# Patient Record
Sex: Male | Born: 1975 | Race: White | Hispanic: No | Marital: Single
Health system: Southern US, Community
[De-identification: ages and names within clinical notes are randomized; demographics above are authoritative.]

---

## 2007-08-11 ENCOUNTER — Other Ambulatory Visit: Payer: Self-pay

## 2007-08-12 ENCOUNTER — Ambulatory Visit: Payer: Self-pay | Admitting: Psychiatry

## 2007-08-12 ENCOUNTER — Inpatient Hospital Stay (HOSPITAL_COMMUNITY): Admission: AD | Admit: 2007-08-12 | Discharge: 2007-08-20 | Payer: Self-pay | Admitting: Psychiatry

## 2008-05-06 ENCOUNTER — Emergency Department (HOSPITAL_COMMUNITY): Admission: EM | Admit: 2008-05-06 | Discharge: 2008-05-06 | Payer: Self-pay | Admitting: Emergency Medicine

## 2009-02-14 ENCOUNTER — Encounter: Payer: Self-pay | Admitting: Emergency Medicine

## 2009-02-15 ENCOUNTER — Inpatient Hospital Stay (HOSPITAL_COMMUNITY): Admission: RE | Admit: 2009-02-15 | Discharge: 2009-02-22 | Payer: Self-pay | Admitting: Psychiatry

## 2009-02-15 ENCOUNTER — Ambulatory Visit: Payer: Self-pay | Admitting: Psychiatry

## 2009-02-16 ENCOUNTER — Other Ambulatory Visit: Payer: Self-pay | Admitting: Emergency Medicine

## 2009-03-16 ENCOUNTER — Other Ambulatory Visit: Payer: Self-pay | Admitting: Emergency Medicine

## 2009-03-16 ENCOUNTER — Inpatient Hospital Stay (HOSPITAL_COMMUNITY): Admission: RE | Admit: 2009-03-16 | Discharge: 2009-03-24 | Payer: Self-pay | Admitting: Psychiatry

## 2009-03-16 ENCOUNTER — Ambulatory Visit: Payer: Self-pay | Admitting: Psychiatry

## 2009-05-13 ENCOUNTER — Emergency Department (HOSPITAL_COMMUNITY): Admission: EM | Admit: 2009-05-13 | Discharge: 2009-05-13 | Payer: Self-pay | Admitting: Emergency Medicine

## 2011-01-13 LAB — CBC
HCT: 45.4 % (ref 39.0–52.0)
MCV: 87.7 fL (ref 78.0–100.0)
Platelets: 169 10*3/uL (ref 150–400)
RBC: 5.17 MIL/uL (ref 4.22–5.81)
WBC: 9.5 10*3/uL (ref 4.0–10.5)

## 2011-01-13 LAB — DIFFERENTIAL
Basophils Absolute: 0 10*3/uL (ref 0.0–0.1)
Basophils Relative: 0 % (ref 0–1)
Monocytes Relative: 6 % (ref 3–12)
Neutro Abs: 6.8 10*3/uL (ref 1.7–7.7)
Neutrophils Relative %: 71 % (ref 43–77)

## 2011-01-13 LAB — BASIC METABOLIC PANEL
BUN: 10 mg/dL (ref 6–23)
Chloride: 107 mEq/L (ref 96–112)
GFR calc Af Amer: >60 mL/min (ref >60)
GFR calc non Af Amer: >60 mL/min (ref >60)
Potassium: 3.5 mEq/L (ref 3.5–5.1)
Sodium: 139 mEq/L (ref 135–145)

## 2011-01-13 LAB — ETHANOL: Alcohol, Ethyl (B): <5 mg/dL (ref 0–10)

## 2011-01-13 LAB — RAPID URINE DRUG SCREEN, HOSP PERFORMED: Tetrahydrocannabinol: NOT DETECTED

## 2011-01-15 LAB — GLUCOSE, CAPILLARY
Glucose-Capillary: 148 mg/dL — ABNORMAL HIGH (ref 70–99)
Glucose-Capillary: 200 mg/dL — ABNORMAL HIGH (ref 70–99)
Glucose-Capillary: 228 mg/dL — ABNORMAL HIGH (ref 70–99)
Glucose-Capillary: 103 mg/dL — ABNORMAL HIGH (ref 70–99)
Glucose-Capillary: 105 mg/dL — ABNORMAL HIGH (ref 70–99)
Glucose-Capillary: 114 mg/dL — ABNORMAL HIGH (ref 70–99)
Glucose-Capillary: 125 mg/dL — ABNORMAL HIGH (ref 70–99)
Glucose-Capillary: 125 mg/dL — ABNORMAL HIGH (ref 70–99)
Glucose-Capillary: 134 mg/dL — ABNORMAL HIGH (ref 70–99)
Glucose-Capillary: 154 mg/dL — ABNORMAL HIGH (ref 70–99)
Glucose-Capillary: 176 mg/dL — ABNORMAL HIGH (ref 70–99)
Glucose-Capillary: 178 mg/dL — ABNORMAL HIGH (ref 70–99)
Glucose-Capillary: 79 mg/dL (ref 70–99)
Glucose-Capillary: 92 mg/dL (ref 70–99)
Glucose-Capillary: 97 mg/dL (ref 70–99)
Glucose-Capillary: 80 mg/dL (ref 70–99)

## 2011-01-15 LAB — RAPID URINE DRUG SCREEN, HOSP PERFORMED
Amphetamines: NOT DETECTED
Barbiturates: NOT DETECTED

## 2011-01-15 LAB — CBC
MCHC: 35.2 g/dL (ref 30.0–36.0)
MCV: 88.1 fL (ref 78.0–100.0)
Platelets: 160 10*3/uL (ref 150–400)
RDW: 13.8 % (ref 11.5–15.5)

## 2011-01-15 LAB — BASIC METABOLIC PANEL
BUN: 11 mg/dL (ref 6–23)
CO2: 31 mEq/L (ref 19–32)
Chloride: 105 mEq/L (ref 96–112)
Creatinine, Ser: 0.71 mg/dL (ref 0.4–1.5)
Glucose, Bld: 116 mg/dL — ABNORMAL HIGH (ref 70–99)

## 2011-01-15 LAB — DIFFERENTIAL
Basophils Absolute: 0 10*3/uL (ref 0.0–0.1)
Basophils Relative: 0 % (ref 0–1)
Eosinophils Absolute: 0.1 10*3/uL (ref 0.0–0.7)
Monocytes Relative: 9 % (ref 3–12)
Neutrophils Relative %: 67 % (ref 43–77)

## 2011-01-15 LAB — URINALYSIS, ROUTINE W REFLEX MICROSCOPIC
Hgb urine dipstick: NEGATIVE
Protein, ur: NEGATIVE mg/dL
Urobilinogen, UA: 0.2 mg/dL (ref 0.0–1.0)

## 2011-01-15 LAB — VALPROIC ACID LEVEL: Valproic Acid Lvl: 31.4 ug/mL — ABNORMAL LOW (ref 50.0–100.0)

## 2011-01-16 LAB — URINALYSIS, ROUTINE W REFLEX MICROSCOPIC
Bilirubin Urine: NEGATIVE
Glucose, UA: NEGATIVE mg/dL
Glucose, UA: NEGATIVE mg/dL
Hgb urine dipstick: NEGATIVE
Nitrite: NEGATIVE
Protein, ur: NEGATIVE mg/dL
Protein, ur: NEGATIVE mg/dL
Urobilinogen, UA: 0.2 mg/dL (ref 0.0–1.0)
Urobilinogen, UA: 0.2 mg/dL (ref 0.0–1.0)

## 2011-01-16 LAB — CBC
MCHC: 35.3 g/dL (ref 30.0–36.0)
MCV: 87 fL (ref 78.0–100.0)
Platelets: 179 10*3/uL (ref 150–400)
RBC: 5.43 MIL/uL (ref 4.22–5.81)
WBC: 8.3 10*3/uL (ref 4.0–10.5)

## 2011-01-16 LAB — COMPREHENSIVE METABOLIC PANEL
ALT: 31 U/L (ref 0–53)
AST: 34 U/L (ref 0–37)
Albumin: 4.5 g/dL (ref 3.5–5.2)
CO2: 28 mEq/L (ref 19–32)
Calcium: 9.5 mg/dL (ref 8.4–10.5)
Creatinine, Ser: 0.75 mg/dL (ref 0.4–1.5)
GFR calc Af Amer: >60 mL/min (ref >60)
GFR calc non Af Amer: >60 mL/min (ref >60)
Sodium: 136 mEq/L (ref 135–145)

## 2011-01-16 LAB — GLUCOSE, CAPILLARY
Glucose-Capillary: 108 mg/dL — ABNORMAL HIGH (ref 70–99)
Glucose-Capillary: 117 mg/dL — ABNORMAL HIGH (ref 70–99)
Glucose-Capillary: 201 mg/dL — ABNORMAL HIGH (ref 70–99)
Glucose-Capillary: 103 mg/dL — ABNORMAL HIGH (ref 70–99)
Glucose-Capillary: 129 mg/dL — ABNORMAL HIGH (ref 70–99)
Glucose-Capillary: 145 mg/dL — ABNORMAL HIGH (ref 70–99)
Glucose-Capillary: 152 mg/dL — ABNORMAL HIGH (ref 70–99)
Glucose-Capillary: 171 mg/dL — ABNORMAL HIGH (ref 70–99)
Glucose-Capillary: 90 mg/dL (ref 70–99)
Glucose-Capillary: 92 mg/dL (ref 70–99)
Glucose-Capillary: 95 mg/dL (ref 70–99)

## 2011-01-16 LAB — DIFFERENTIAL
Eosinophils Absolute: 0 10*3/uL (ref 0.0–0.7)
Eosinophils Relative: 0 % (ref 0–5)
Lymphocytes Relative: 21 % (ref 12–46)
Lymphs Abs: 1.7 10*3/uL (ref 0.7–4.0)
Monocytes Absolute: 0.5 10*3/uL (ref 0.1–1.0)
Monocytes Relative: 7 % (ref 3–12)

## 2011-01-16 LAB — RAPID URINE DRUG SCREEN, HOSP PERFORMED
Amphetamines: NOT DETECTED
Benzodiazepines: NOT DETECTED
Opiates: NOT DETECTED
Tetrahydrocannabinol: NOT DETECTED

## 2011-01-16 LAB — URINE CULTURE
Colony Count: NO GROWTH
Culture: NO GROWTH

## 2011-01-16 LAB — T3, FREE: T3, Free: 2.5 pg/mL (ref 2.3–4.2)

## 2011-01-16 LAB — VALPROIC ACID LEVEL: Valproic Acid Lvl: 77.7 ug/mL (ref 50.0–100.0)

## 2011-01-16 LAB — T4, FREE: Free T4: 0.86 ng/dL (ref 0.80–1.80)

## 2011-01-16 LAB — TSH: TSH: 1.085 u[IU]/mL (ref 0.350–4.500)
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
## 2011-02-20 NOTE — H&P (Signed)
NAME:  Vincent Graves, Vincent Graves NO.:  [id number]   MEDICAL RECORD NO.:  [id number]          PATIENT TYPE:  IPS   LOCATION:  0407                          [facility]ACILITY:  [facility]   PHYSICIAN:  Anselm Jungling, MD  DATE OF BIRTH:  04-May-1976   DATE OF ADMISSION:  03/16/2009  DATE OF DISCHARGE:                       PSYCHIATRIC ADMISSION ASSESSMENT   PATIENT IDENTIFICATION:  A 35 year old male voluntarily admitted March 16, 2009.   HISTORY OF PRESENT ILLNESS:  The patient presents with a history of  suicidal thoughts with no specific plan.  States his living situation is  bad and wants a group home.  He has a history of chronic back pain and  diabetes, has poor social support.  Denies any hallucinations.  Denies  any substance use.   PAST PSYCHIATRIC HISTORY:  The patient is here May 2010 for exacerbation  of psychosis.  States he has recently been seeing a therapist named Leonia Reeves.   SOCIAL HISTORY:  Lives in [place].  He is single and considers  himself homeless at this time.   FAMILY HISTORY:  Unknown.   ALCOHOL AND DRUG HABITS:  Denies any alcohol or substance use.   PRIMARY CARE Lessly Stigler:  Dr. Jorene Guest in [place].   MEDICAL PROBLEMS:  1. History of chronic back pain.  2. Type 2 diabetes.   MEDICATIONS:  1. Methadone 10 mg q.i.d.  2. Depakote 59 mg q.h.s.  3. Metformin 500 mg daily.  4. BuSpar 30 mg t.i.d.  5. Actos 2 mg daily.  6. Nasonex nasal spray 50 mcg as needed.  7. Methylin 20 mg daily.  8. Flomax.   DRUG ALLERGIES:  PENICILLIN, MOTRIN, CODEINE, TEGRETOL, TRAZODONE,  CONTRAST MEDIA, CYMBALTA WHICH CAUSES URINARY RETENTION.   LABORATORY DATA:  The patient was assessed at [facility].   PHYSICAL EXAMINATION:  Physical examination was reviewed.  No  significant findings.  Vital signs:  Temperature 98.3, 67 heart rate, 60  respirations, blood pressure is 119/80.  Valproic acid level of 31.4,  low therapeutic range.  Glucose of 80.  CBC  within normal limits.  Urine  drug screen is negative.   MENTAL STATUS EXAM:  He is a fully alert, cooperative male, casually  dressed, fair eye contact.  Speech is soft-spoken.  The patient's affect  is depressed.  Thought process:  Denies any suicidal thoughts.  No  delusional statements made.  Denies any current suicidal thoughts.  Cognitive function intact.  Memory appears intact.  Judgment insight is  fair.   DISCHARGE DIAGNOSES:  AXIS I:  Mood disorder NOS.  AXIS II:  Deferred.  AXIS III:  Back pain, type 2 diabetes.  AXIS IV:  Problems with housing, medical problems, possible other  psychosocial problems related to lack of support.  AXIS V:  Current is 30-35.   PLAN:  Our plan is to clarify medications with the patient's pharmacy.  Will check CBGs b.i.d.  We will put patient on 60 grams carbohydrate  diet.  Case manager will assess his living condition.  We will continue  to review medication and his support group.  Tentative length of  stay is  3-5 days.      Landry Corporal, N.P.      Anselm Jungling, MD  Electronically Signed    JO/MEDQ  D:  03/17/2009  T:  03/17/2009  Job:  [id number]

## 2011-02-20 NOTE — Discharge Summary (Signed)
NAME:  Vincent Graves, Vincent Graves NO.:  [id number]   MEDICAL RECORD NO.:  [id number]          PATIENT TYPE:  IPS   LOCATION:  0407                          FACILITY:  [facility]   PHYSICIAN:  Jasmine Pang, M.D. DATE OF BIRTH:  05-Apr-1976   DATE OF ADMISSION:  03/16/2009  DATE OF DISCHARGE:  03/24/2009                               DISCHARGE SUMMARY   IDENTIFICATION:  This is a 35 year old white male, who was admitted on a  voluntary basis on March 16, 2009.   HISTORY OF PRESENT ILLNESS:  The patient presents with a history of  suicidal thoughts with no specific plan.  He states his living situation  is bad and he wants a group home.  He has a history of chronic back  pain and diabetes.  He has poor social support.  He denies any  hallucinations.  He denies any substance use.  The patient was here in  May 2010 for exacerbation of psychosis.  He states he has recently been  seeing a therapist named Everlene Balls.  For further admission information  see psychiatric admission assessment.   PHYSICAL FINDINGS:  Physical exam was reviewed.  There were no  significant findings.   DIAGNOSTIC STUDIES:  Valproic acid level was 31.4 (50-100).  Glucose was  80.  CBC was within normal limits.  Urine drug screen was negative.   HOSPITAL COURSE:  Upon admission, the patient was restarted on his home  medications of methadone 10 mg p.o. q.i.d., Depakote 1500 mg p.o.  q.h.s., metformin 500 mg p.o. daily, buspirone 30 mg p.o. t.i.d.,  glimepiride 2 mg p.o. daily, fluticasone spray 50 mcg inhaled p.r.n.  daily, __________ 20 mg p.o. q.a.m.  He was also started on ketoconazole  shampoo p.r.n.  Due to some nausea and vomiting he was started on Zofran  4-8 mg p.o. every 6 hours as needed for nausea and vomiting.  In  individual sessions, the patient was well-developed, well-nourished  male, who was alert and oriented x4.  He was pleasant, but depressed and  grim with no overt symptoms of psychosis or  thought disorder.  He wanted  help.  There was no active suicidal ideation.  On March 18, 2009, the  patient's medical situation had been discussed with his family  physician.  He wanted to get off the methadone.  He agrees to go down to  10 mg b.i.d. for now and this was ordered.  He was also started on  lidocaine patch to the back daily and Voltaren 75 mg p.o. b.i.d., and  Lamisil to his feet b.i.d., and Nizoral 2% shampoo daily.  On March 19, 2009, the patient stated I am alright.  On March 21, 2009, the patient  was less depressed, less anxious.  Sleep was good.  Appetite was good.  He was upset that his Ritalin had been discontinued, and on March 22, 2009, was somewhat irritable due to this.  Sleep continued to be good.  Appetite good.  On March 23, 2009,  he continued to be frustrated that  his Ritalin had been discontinued.  He was slightly less irritable on  the day before.  We discussed the possibility of restarting his Ritalin.  He was also was started on Remeron 15 mg p.o. q.h.s. p.r.n. insomnia.  Trazodone was discontinued and methadone was decreased to 5 mg p.o.  t.i.d.  He was also started Skelaxin 800 mg p.o. t.i.d. for pain,  Ritalin was restarted at 20 mg p.o. q.a.m.  On March 24, 2009, the  patient wanted to go home after several phone calls with his sister.  He  decided to return to her help.  He feels optimistic about understanding  they came to.  He was going to follow up with [facility] in [place]. His  mood was less depressed, less anxious.  Affect was consistent with mood.  There was no suicidal or homicidal ideation.  No thoughts of self  injurious behavior.  No auditory or visual hallucinations.  No paranoia  or delusions.  Thoughts were logical and goal-directed.  Thought  content, no predominant theme.  Cognitive was grossly intact.  Insight  fair.  Judgment good.  Impulse control good.  The patient wanted to go  home today and was felt to be safe for discharge.  His  sister will pick  him up.   DISCHARGE DIAGNOSES:  Axis I:  Mood disorder, not otherwise specified.  Attention deficit hyperactivity disorder, inattentive type.  Axis II:  Features of personality disorder, not otherwise specified.  Axis III:  Back pain, type 2 diabetes.  Axis IV:  Severe (problems with housing, medical problems, other  psychosocial problems related to lack of support).  Axis V:  Global assessment of functioning was 50 upon discharge.  GAF  was 30-35 upon admission.  GAF highest past year was 60-65.   DISCHARGE PLANS:  There were no specific activity level or dietary  restrictions.   POST HOSPITAL CARE PLANS:  The patient will see Dr. Helyn App in [place],  [place], on March 28, 2009, at 1:30 p.m. and he will also go to  [place] on April 07, 2009, at 10 o'clock a.m.   DISCHARGE MEDICATIONS:  1. Depakote 500 mg 3 p.o. q.h.s.  2. BuSpar 30 mg 3 times a day.  3. Amaryl 2 mg daily.  4. Glucophage 500 mg daily.  5. Nizoral shampoo as needed.  6. Methadone 10 mg daily.  7. Ritalin 20 mg p.o. q.a.m. as prescribed by his doctor.   He is to see his doctor for his medical problems.      Jasmine Pang, M.D.  Electronically Signed     BHS/MEDQ  D:  03/24/2009  T:  03/25/2009  Job:  [id number]
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
## 2011-03-12 ENCOUNTER — Emergency Department (HOSPITAL_COMMUNITY)
Admission: EM | Admit: 2011-03-12 | Discharge: 2011-03-12 | Disposition: A | Discharge status: Home / Self Care | Payer: Medicare Other | Attending: Emergency Medicine | Admitting: Emergency Medicine

## 2011-03-12 ENCOUNTER — Emergency Department (HOSPITAL_COMMUNITY): Payer: Medicare Other

## 2011-03-12 DIAGNOSIS — Z9889 Other specified postprocedural states: Secondary | ICD-10-CM | POA: Insufficient documentation

## 2011-03-12 DIAGNOSIS — M545 Low back pain, unspecified: Secondary | ICD-10-CM | POA: Insufficient documentation

## 2011-03-12 DIAGNOSIS — Z79899 Other long term (current) drug therapy: Secondary | ICD-10-CM | POA: Insufficient documentation

## 2011-03-12 DIAGNOSIS — G8929 Other chronic pain: Secondary | ICD-10-CM | POA: Insufficient documentation

## 2011-03-12 DIAGNOSIS — F3130 Bipolar disorder, current episode depressed, mild or moderate severity, unspecified: Secondary | ICD-10-CM | POA: Insufficient documentation

## 2011-03-12 DIAGNOSIS — Y93E1 Activity, personal bathing and showering: Secondary | ICD-10-CM | POA: Insufficient documentation

## 2011-03-12 DIAGNOSIS — W010XXA Fall on same level from slipping, tripping and stumbling without subsequent striking against object, initial encounter: Secondary | ICD-10-CM | POA: Insufficient documentation

## 2011-03-12 DIAGNOSIS — E119 Type 2 diabetes mellitus without complications: Secondary | ICD-10-CM | POA: Insufficient documentation

## 2011-07-17 LAB — CBC
MCHC: 34.5
MCV: 85.9
Platelets: 196
WBC: 9

## 2011-07-17 LAB — DIFFERENTIAL
Basophils Relative: 1
Eosinophils Absolute: 0.1
Neutro Abs: 6.6
Neutrophils Relative %: 74

## 2011-07-17 LAB — HEPATIC FUNCTION PANEL
ALT: 51
Bilirubin, Direct: <0.1
Total Protein: 6.6

## 2011-07-17 LAB — TSH: TSH: 0.926

## 2011-07-17 LAB — ETHANOL: Alcohol, Ethyl (B): <5

## 2011-07-17 LAB — BASIC METABOLIC PANEL
BUN: 9
Calcium: 8.6
Chloride: 104
Creatinine, Ser: 0.82
GFR calc Af Amer: >60

## 2011-07-17 LAB — RAPID URINE DRUG SCREEN, HOSP PERFORMED
Barbiturates: NOT DETECTED
Cocaine: NOT DETECTED
Opiates: NOT DETECTED

## 2017-03-14 DIAGNOSIS — Z794 Long term (current) use of insulin: Secondary | ICD-10-CM | POA: Diagnosis not present

## 2017-03-14 DIAGNOSIS — E119 Type 2 diabetes mellitus without complications: Secondary | ICD-10-CM | POA: Diagnosis not present

## 2017-05-03 DIAGNOSIS — R140 Abdominal distension (gaseous): Secondary | ICD-10-CM | POA: Diagnosis not present

## 2017-05-03 DIAGNOSIS — K210 Gastro-esophageal reflux disease with esophagitis: Secondary | ICD-10-CM | POA: Diagnosis not present

## 2017-05-03 DIAGNOSIS — R142 Eructation: Secondary | ICD-10-CM | POA: Diagnosis not present

## 2017-05-03 DIAGNOSIS — I10 Essential (primary) hypertension: Secondary | ICD-10-CM | POA: Diagnosis not present

## 2017-05-03 DIAGNOSIS — R1110 Vomiting, unspecified: Secondary | ICD-10-CM | POA: Diagnosis not present

## 2017-05-09 DIAGNOSIS — Z91041 Radiographic dye allergy status: Secondary | ICD-10-CM | POA: Diagnosis not present

## 2017-05-09 DIAGNOSIS — I10 Essential (primary) hypertension: Secondary | ICD-10-CM | POA: Diagnosis not present

## 2017-05-09 DIAGNOSIS — Z794 Long term (current) use of insulin: Secondary | ICD-10-CM | POA: Diagnosis not present

## 2017-05-09 DIAGNOSIS — M5137 Other intervertebral disc degeneration, lumbosacral region: Secondary | ICD-10-CM | POA: Diagnosis not present

## 2017-05-09 DIAGNOSIS — M47897 Other spondylosis, lumbosacral region: Secondary | ICD-10-CM | POA: Diagnosis not present

## 2017-05-09 DIAGNOSIS — M47896 Other spondylosis, lumbar region: Secondary | ICD-10-CM | POA: Diagnosis not present

## 2017-05-09 DIAGNOSIS — G8929 Other chronic pain: Secondary | ICD-10-CM | POA: Diagnosis not present

## 2017-05-09 DIAGNOSIS — E1165 Type 2 diabetes mellitus with hyperglycemia: Secondary | ICD-10-CM | POA: Diagnosis not present

## 2017-05-09 DIAGNOSIS — M545 Low back pain: Secondary | ICD-10-CM | POA: Diagnosis not present

## 2017-05-09 DIAGNOSIS — M549 Dorsalgia, unspecified: Secondary | ICD-10-CM | POA: Diagnosis not present

## 2017-05-09 DIAGNOSIS — K219 Gastro-esophageal reflux disease without esophagitis: Secondary | ICD-10-CM | POA: Diagnosis not present

## 2017-05-09 DIAGNOSIS — E785 Hyperlipidemia, unspecified: Secondary | ICD-10-CM | POA: Diagnosis not present

## 2017-05-09 DIAGNOSIS — Z79899 Other long term (current) drug therapy: Secondary | ICD-10-CM | POA: Diagnosis not present

## 2017-05-09 DIAGNOSIS — Z885 Allergy status to narcotic agent status: Secondary | ICD-10-CM | POA: Diagnosis not present

## 2017-05-09 DIAGNOSIS — R300 Dysuria: Secondary | ICD-10-CM | POA: Diagnosis not present

## 2017-05-09 DIAGNOSIS — Z888 Allergy status to other drugs, medicaments and biological substances status: Secondary | ICD-10-CM | POA: Diagnosis not present

## 2017-05-31 DIAGNOSIS — J3089 Other allergic rhinitis: Secondary | ICD-10-CM | POA: Diagnosis not present

## 2017-05-31 DIAGNOSIS — J069 Acute upper respiratory infection, unspecified: Secondary | ICD-10-CM | POA: Diagnosis not present

## 2017-05-31 DIAGNOSIS — J302 Other seasonal allergic rhinitis: Secondary | ICD-10-CM | POA: Diagnosis not present

## 2017-05-31 DIAGNOSIS — Z862 Personal history of diseases of the blood and blood-forming organs and certain disorders involving the immune mechanism: Secondary | ICD-10-CM | POA: Diagnosis not present

## 2017-05-31 DIAGNOSIS — B9789 Other viral agents as the cause of diseases classified elsewhere: Secondary | ICD-10-CM | POA: Diagnosis not present

## 2017-05-31 DIAGNOSIS — E119 Type 2 diabetes mellitus without complications: Secondary | ICD-10-CM | POA: Diagnosis not present

## 2017-05-31 DIAGNOSIS — E871 Hypo-osmolality and hyponatremia: Secondary | ICD-10-CM | POA: Diagnosis not present

## 2017-05-31 DIAGNOSIS — E1165 Type 2 diabetes mellitus with hyperglycemia: Secondary | ICD-10-CM | POA: Diagnosis not present

## 2017-05-31 DIAGNOSIS — Z794 Long term (current) use of insulin: Secondary | ICD-10-CM | POA: Diagnosis not present

## 2017-08-22 DIAGNOSIS — E119 Type 2 diabetes mellitus without complications: Secondary | ICD-10-CM | POA: Diagnosis not present

## 2017-08-22 DIAGNOSIS — E785 Hyperlipidemia, unspecified: Secondary | ICD-10-CM | POA: Diagnosis not present

## 2017-08-22 DIAGNOSIS — G8929 Other chronic pain: Secondary | ICD-10-CM | POA: Diagnosis not present

## 2017-08-22 DIAGNOSIS — Z794 Long term (current) use of insulin: Secondary | ICD-10-CM | POA: Diagnosis not present

## 2017-08-22 DIAGNOSIS — M545 Low back pain: Secondary | ICD-10-CM | POA: Diagnosis not present

## 2017-08-31 DIAGNOSIS — M79642 Pain in left hand: Secondary | ICD-10-CM | POA: Diagnosis not present

## 2017-09-13 DIAGNOSIS — M79642 Pain in left hand: Secondary | ICD-10-CM | POA: Diagnosis not present

## 2017-11-01 DIAGNOSIS — E785 Hyperlipidemia, unspecified: Secondary | ICD-10-CM | POA: Diagnosis not present

## 2017-11-01 DIAGNOSIS — M7522 Bicipital tendinitis, left shoulder: Secondary | ICD-10-CM | POA: Diagnosis not present

## 2017-11-01 DIAGNOSIS — M7582 Other shoulder lesions, left shoulder: Secondary | ICD-10-CM | POA: Diagnosis not present

## 2017-11-01 DIAGNOSIS — E119 Type 2 diabetes mellitus without complications: Secondary | ICD-10-CM | POA: Diagnosis not present

## 2017-11-01 DIAGNOSIS — Z794 Long term (current) use of insulin: Secondary | ICD-10-CM | POA: Diagnosis not present

## 2017-11-04 DIAGNOSIS — I10 Essential (primary) hypertension: Secondary | ICD-10-CM | POA: Diagnosis not present

## 2017-11-04 DIAGNOSIS — K210 Gastro-esophageal reflux disease with esophagitis: Secondary | ICD-10-CM | POA: Diagnosis not present

## 2017-11-04 DIAGNOSIS — R1110 Vomiting, unspecified: Secondary | ICD-10-CM | POA: Diagnosis not present

## 2017-11-04 DIAGNOSIS — Z8619 Personal history of other infectious and parasitic diseases: Secondary | ICD-10-CM | POA: Diagnosis not present

## 2017-11-12 DIAGNOSIS — Z8619 Personal history of other infectious and parasitic diseases: Secondary | ICD-10-CM | POA: Diagnosis not present

## 2017-11-17 DIAGNOSIS — I10 Essential (primary) hypertension: Secondary | ICD-10-CM | POA: Diagnosis not present

## 2017-11-17 DIAGNOSIS — R000 Tachycardia, unspecified: Secondary | ICD-10-CM | POA: Diagnosis not present

## 2017-11-17 DIAGNOSIS — E7800 Pure hypercholesterolemia, unspecified: Secondary | ICD-10-CM | POA: Diagnosis not present

## 2017-11-17 DIAGNOSIS — T50904A Poisoning by unspecified drugs, medicaments and biological substances, undetermined, initial encounter: Secondary | ICD-10-CM | POA: Diagnosis not present

## 2017-11-17 DIAGNOSIS — E119 Type 2 diabetes mellitus without complications: Secondary | ICD-10-CM | POA: Diagnosis not present

## 2017-11-18 DIAGNOSIS — E119 Type 2 diabetes mellitus without complications: Secondary | ICD-10-CM | POA: Diagnosis not present

## 2017-11-18 DIAGNOSIS — I10 Essential (primary) hypertension: Secondary | ICD-10-CM | POA: Diagnosis not present

## 2017-11-18 DIAGNOSIS — E7800 Pure hypercholesterolemia, unspecified: Secondary | ICD-10-CM | POA: Diagnosis not present

## 2017-11-19 DIAGNOSIS — I10 Essential (primary) hypertension: Secondary | ICD-10-CM | POA: Diagnosis not present

## 2017-11-19 DIAGNOSIS — E7800 Pure hypercholesterolemia, unspecified: Secondary | ICD-10-CM | POA: Diagnosis not present

## 2017-11-19 DIAGNOSIS — E119 Type 2 diabetes mellitus without complications: Secondary | ICD-10-CM | POA: Diagnosis not present

## 2017-11-20 DIAGNOSIS — E119 Type 2 diabetes mellitus without complications: Secondary | ICD-10-CM | POA: Diagnosis not present

## 2017-11-20 DIAGNOSIS — I10 Essential (primary) hypertension: Secondary | ICD-10-CM | POA: Diagnosis not present

## 2017-11-20 DIAGNOSIS — E7800 Pure hypercholesterolemia, unspecified: Secondary | ICD-10-CM | POA: Diagnosis not present

## 2017-11-21 DIAGNOSIS — E7800 Pure hypercholesterolemia, unspecified: Secondary | ICD-10-CM | POA: Diagnosis not present

## 2017-11-21 DIAGNOSIS — E119 Type 2 diabetes mellitus without complications: Secondary | ICD-10-CM | POA: Diagnosis not present

## 2017-11-21 DIAGNOSIS — I10 Essential (primary) hypertension: Secondary | ICD-10-CM | POA: Diagnosis not present

## 2017-12-16 DIAGNOSIS — R0789 Other chest pain: Secondary | ICD-10-CM | POA: Diagnosis not present

## 2017-12-16 DIAGNOSIS — R1032 Left lower quadrant pain: Secondary | ICD-10-CM | POA: Diagnosis not present

## 2017-12-25 DIAGNOSIS — I10 Essential (primary) hypertension: Secondary | ICD-10-CM | POA: Diagnosis not present

## 2017-12-25 DIAGNOSIS — R1012 Left upper quadrant pain: Secondary | ICD-10-CM | POA: Diagnosis not present

## 2017-12-25 DIAGNOSIS — K5909 Other constipation: Secondary | ICD-10-CM | POA: Diagnosis not present

## 2017-12-25 DIAGNOSIS — R1310 Dysphagia, unspecified: Secondary | ICD-10-CM | POA: Diagnosis not present

## 2017-12-30 DIAGNOSIS — R1310 Dysphagia, unspecified: Secondary | ICD-10-CM | POA: Diagnosis not present

## 2017-12-30 DIAGNOSIS — K317 Polyp of stomach and duodenum: Secondary | ICD-10-CM | POA: Diagnosis not present

## 2018-01-06 DIAGNOSIS — K219 Gastro-esophageal reflux disease without esophagitis: Secondary | ICD-10-CM | POA: Diagnosis not present

## 2018-01-06 DIAGNOSIS — Z794 Long term (current) use of insulin: Secondary | ICD-10-CM | POA: Diagnosis not present

## 2018-01-06 DIAGNOSIS — I10 Essential (primary) hypertension: Secondary | ICD-10-CM | POA: Diagnosis not present

## 2018-01-06 DIAGNOSIS — E1165 Type 2 diabetes mellitus with hyperglycemia: Secondary | ICD-10-CM | POA: Diagnosis not present

## 2018-01-06 DIAGNOSIS — R1012 Left upper quadrant pain: Secondary | ICD-10-CM | POA: Diagnosis not present

## 2018-01-06 DIAGNOSIS — Z8619 Personal history of other infectious and parasitic diseases: Secondary | ICD-10-CM | POA: Diagnosis not present

## 2018-01-06 DIAGNOSIS — Z8719 Personal history of other diseases of the digestive system: Secondary | ICD-10-CM | POA: Diagnosis not present

## 2018-01-06 DIAGNOSIS — R109 Unspecified abdominal pain: Secondary | ICD-10-CM | POA: Diagnosis not present

## 2018-02-16 DIAGNOSIS — Z885 Allergy status to narcotic agent status: Secondary | ICD-10-CM | POA: Diagnosis not present

## 2018-02-16 DIAGNOSIS — Z79899 Other long term (current) drug therapy: Secondary | ICD-10-CM | POA: Diagnosis not present

## 2018-02-16 DIAGNOSIS — E785 Hyperlipidemia, unspecified: Secondary | ICD-10-CM | POA: Diagnosis not present

## 2018-02-16 DIAGNOSIS — Z794 Long term (current) use of insulin: Secondary | ICD-10-CM | POA: Diagnosis not present

## 2018-02-16 DIAGNOSIS — R1012 Left upper quadrant pain: Secondary | ICD-10-CM | POA: Diagnosis not present

## 2018-02-16 DIAGNOSIS — R1013 Epigastric pain: Secondary | ICD-10-CM | POA: Diagnosis not present

## 2018-02-16 DIAGNOSIS — R1011 Right upper quadrant pain: Secondary | ICD-10-CM | POA: Diagnosis not present

## 2018-02-16 DIAGNOSIS — Z87442 Personal history of urinary calculi: Secondary | ICD-10-CM | POA: Diagnosis not present

## 2018-02-16 DIAGNOSIS — I10 Essential (primary) hypertension: Secondary | ICD-10-CM | POA: Diagnosis not present

## 2018-02-16 DIAGNOSIS — E119 Type 2 diabetes mellitus without complications: Secondary | ICD-10-CM | POA: Diagnosis not present

## 2018-03-13 DIAGNOSIS — R110 Nausea: Secondary | ICD-10-CM | POA: Diagnosis not present

## 2018-03-13 DIAGNOSIS — R1012 Left upper quadrant pain: Secondary | ICD-10-CM | POA: Diagnosis not present

## 2018-04-01 DIAGNOSIS — R110 Nausea: Secondary | ICD-10-CM | POA: Diagnosis not present

## 2018-04-01 DIAGNOSIS — R1012 Left upper quadrant pain: Secondary | ICD-10-CM | POA: Diagnosis not present

## 2018-04-03 DIAGNOSIS — E118 Type 2 diabetes mellitus with unspecified complications: Secondary | ICD-10-CM | POA: Diagnosis not present

## 2018-04-03 DIAGNOSIS — Z8679 Personal history of other diseases of the circulatory system: Secondary | ICD-10-CM | POA: Diagnosis not present

## 2018-04-03 DIAGNOSIS — R1013 Epigastric pain: Secondary | ICD-10-CM | POA: Diagnosis not present

## 2018-04-03 DIAGNOSIS — R1011 Right upper quadrant pain: Secondary | ICD-10-CM | POA: Diagnosis not present

## 2018-04-03 DIAGNOSIS — R000 Tachycardia, unspecified: Secondary | ICD-10-CM | POA: Diagnosis not present

## 2018-04-03 DIAGNOSIS — R1032 Left lower quadrant pain: Secondary | ICD-10-CM | POA: Diagnosis not present

## 2018-04-03 DIAGNOSIS — R1084 Generalized abdominal pain: Secondary | ICD-10-CM | POA: Diagnosis not present

## 2018-04-04 DIAGNOSIS — R1084 Generalized abdominal pain: Secondary | ICD-10-CM | POA: Diagnosis not present

## 2018-04-17 DIAGNOSIS — M7989 Other specified soft tissue disorders: Secondary | ICD-10-CM | POA: Diagnosis not present

## 2018-04-17 DIAGNOSIS — Z794 Long term (current) use of insulin: Secondary | ICD-10-CM | POA: Diagnosis not present

## 2018-04-17 DIAGNOSIS — R1032 Left lower quadrant pain: Secondary | ICD-10-CM | POA: Diagnosis not present

## 2018-04-17 DIAGNOSIS — M79641 Pain in right hand: Secondary | ICD-10-CM | POA: Diagnosis not present

## 2018-04-17 DIAGNOSIS — R110 Nausea: Secondary | ICD-10-CM | POA: Diagnosis not present

## 2018-04-17 DIAGNOSIS — E119 Type 2 diabetes mellitus without complications: Secondary | ICD-10-CM | POA: Diagnosis not present

## 2018-04-17 DIAGNOSIS — E785 Hyperlipidemia, unspecified: Secondary | ICD-10-CM | POA: Diagnosis not present

## 2018-05-05 DIAGNOSIS — R1032 Left lower quadrant pain: Secondary | ICD-10-CM | POA: Diagnosis not present

## 2018-05-05 DIAGNOSIS — K210 Gastro-esophageal reflux disease with esophagitis: Secondary | ICD-10-CM | POA: Diagnosis not present

## 2018-05-05 DIAGNOSIS — Z79899 Other long term (current) drug therapy: Secondary | ICD-10-CM | POA: Diagnosis not present

## 2018-05-05 DIAGNOSIS — R110 Nausea: Secondary | ICD-10-CM | POA: Diagnosis not present

## 2018-05-05 DIAGNOSIS — R12 Heartburn: Secondary | ICD-10-CM | POA: Diagnosis not present

## 2018-05-05 DIAGNOSIS — R140 Abdominal distension (gaseous): Secondary | ICD-10-CM | POA: Diagnosis not present

## 2018-05-26 DIAGNOSIS — R109 Unspecified abdominal pain: Secondary | ICD-10-CM | POA: Diagnosis not present

## 2018-05-26 DIAGNOSIS — R1032 Left lower quadrant pain: Secondary | ICD-10-CM | POA: Diagnosis not present

## 2018-05-26 DIAGNOSIS — R110 Nausea: Secondary | ICD-10-CM | POA: Diagnosis not present

## 2018-05-26 DIAGNOSIS — R140 Abdominal distension (gaseous): Secondary | ICD-10-CM | POA: Diagnosis not present

## 2018-05-26 DIAGNOSIS — K219 Gastro-esophageal reflux disease without esophagitis: Secondary | ICD-10-CM | POA: Diagnosis not present

## 2018-05-26 DIAGNOSIS — K5900 Constipation, unspecified: Secondary | ICD-10-CM | POA: Diagnosis not present

## 2018-06-01 DIAGNOSIS — K219 Gastro-esophageal reflux disease without esophagitis: Secondary | ICD-10-CM | POA: Diagnosis not present

## 2018-06-01 DIAGNOSIS — R110 Nausea: Secondary | ICD-10-CM | POA: Diagnosis not present

## 2018-06-11 DIAGNOSIS — S39012A Strain of muscle, fascia and tendon of lower back, initial encounter: Secondary | ICD-10-CM | POA: Diagnosis not present

## 2018-06-11 DIAGNOSIS — M549 Dorsalgia, unspecified: Secondary | ICD-10-CM | POA: Diagnosis not present

## 2018-06-11 DIAGNOSIS — I10 Essential (primary) hypertension: Secondary | ICD-10-CM | POA: Diagnosis not present

## 2018-06-11 DIAGNOSIS — Z794 Long term (current) use of insulin: Secondary | ICD-10-CM | POA: Diagnosis not present

## 2018-06-11 DIAGNOSIS — M47896 Other spondylosis, lumbar region: Secondary | ICD-10-CM | POA: Diagnosis not present

## 2018-06-11 DIAGNOSIS — E785 Hyperlipidemia, unspecified: Secondary | ICD-10-CM | POA: Diagnosis not present

## 2018-06-11 DIAGNOSIS — Z79899 Other long term (current) drug therapy: Secondary | ICD-10-CM | POA: Diagnosis not present

## 2018-06-11 DIAGNOSIS — K219 Gastro-esophageal reflux disease without esophagitis: Secondary | ICD-10-CM | POA: Diagnosis not present

## 2018-06-11 DIAGNOSIS — E119 Type 2 diabetes mellitus without complications: Secondary | ICD-10-CM | POA: Diagnosis not present

## 2018-06-11 DIAGNOSIS — M5136 Other intervertebral disc degeneration, lumbar region: Secondary | ICD-10-CM | POA: Diagnosis not present

## 2018-07-25 DIAGNOSIS — M545 Low back pain: Secondary | ICD-10-CM | POA: Diagnosis not present

## 2018-07-25 DIAGNOSIS — E119 Type 2 diabetes mellitus without complications: Secondary | ICD-10-CM | POA: Diagnosis not present

## 2018-07-25 DIAGNOSIS — Z23 Encounter for immunization: Secondary | ICD-10-CM | POA: Diagnosis not present

## 2018-07-25 DIAGNOSIS — S39012A Strain of muscle, fascia and tendon of lower back, initial encounter: Secondary | ICD-10-CM | POA: Diagnosis not present

## 2018-07-25 DIAGNOSIS — Z794 Long term (current) use of insulin: Secondary | ICD-10-CM | POA: Diagnosis not present

## 2018-08-14 DIAGNOSIS — Z794 Long term (current) use of insulin: Secondary | ICD-10-CM | POA: Diagnosis not present

## 2018-08-14 DIAGNOSIS — E785 Hyperlipidemia, unspecified: Secondary | ICD-10-CM | POA: Diagnosis not present

## 2018-08-14 DIAGNOSIS — R110 Nausea: Secondary | ICD-10-CM | POA: Diagnosis not present

## 2018-08-14 DIAGNOSIS — E1165 Type 2 diabetes mellitus with hyperglycemia: Secondary | ICD-10-CM | POA: Diagnosis not present

## 2018-08-14 DIAGNOSIS — K439 Ventral hernia without obstruction or gangrene: Secondary | ICD-10-CM | POA: Diagnosis not present

## 2018-08-14 DIAGNOSIS — B9681 Helicobacter pylori [H. pylori] as the cause of diseases classified elsewhere: Secondary | ICD-10-CM | POA: Diagnosis not present

## 2018-08-14 DIAGNOSIS — R12 Heartburn: Secondary | ICD-10-CM | POA: Diagnosis not present

## 2018-08-14 DIAGNOSIS — Z8619 Personal history of other infectious and parasitic diseases: Secondary | ICD-10-CM | POA: Diagnosis not present

## 2018-08-14 DIAGNOSIS — K219 Gastro-esophageal reflux disease without esophagitis: Secondary | ICD-10-CM | POA: Diagnosis not present

## 2018-08-14 DIAGNOSIS — I10 Essential (primary) hypertension: Secondary | ICD-10-CM | POA: Diagnosis not present

## 2018-10-14 DIAGNOSIS — M5126 Other intervertebral disc displacement, lumbar region: Secondary | ICD-10-CM | POA: Diagnosis not present

## 2018-11-12 DIAGNOSIS — H524 Presbyopia: Secondary | ICD-10-CM | POA: Diagnosis not present

## 2018-11-14 DIAGNOSIS — G8929 Other chronic pain: Secondary | ICD-10-CM | POA: Diagnosis not present

## 2018-11-14 DIAGNOSIS — S39012A Strain of muscle, fascia and tendon of lower back, initial encounter: Secondary | ICD-10-CM | POA: Diagnosis not present

## 2018-11-14 DIAGNOSIS — E119 Type 2 diabetes mellitus without complications: Secondary | ICD-10-CM | POA: Diagnosis not present

## 2018-11-14 DIAGNOSIS — Z794 Long term (current) use of insulin: Secondary | ICD-10-CM | POA: Diagnosis not present

## 2018-11-14 DIAGNOSIS — M545 Low back pain: Secondary | ICD-10-CM | POA: Diagnosis not present

## 2018-11-19 DIAGNOSIS — K219 Gastro-esophageal reflux disease without esophagitis: Secondary | ICD-10-CM | POA: Diagnosis not present

## 2018-11-19 DIAGNOSIS — Z8619 Personal history of other infectious and parasitic diseases: Secondary | ICD-10-CM | POA: Diagnosis not present

## 2018-11-19 DIAGNOSIS — E785 Hyperlipidemia, unspecified: Secondary | ICD-10-CM | POA: Diagnosis not present

## 2018-11-19 DIAGNOSIS — E119 Type 2 diabetes mellitus without complications: Secondary | ICD-10-CM | POA: Diagnosis not present

## 2018-11-19 DIAGNOSIS — K439 Ventral hernia without obstruction or gangrene: Secondary | ICD-10-CM | POA: Diagnosis not present

## 2018-11-19 DIAGNOSIS — R110 Nausea: Secondary | ICD-10-CM | POA: Diagnosis not present

## 2018-11-19 DIAGNOSIS — Z794 Long term (current) use of insulin: Secondary | ICD-10-CM | POA: Diagnosis not present

## 2018-11-19 DIAGNOSIS — I10 Essential (primary) hypertension: Secondary | ICD-10-CM | POA: Diagnosis not present

## 2018-11-19 DIAGNOSIS — Z79899 Other long term (current) drug therapy: Secondary | ICD-10-CM | POA: Diagnosis not present

## 2018-12-18 DIAGNOSIS — E119 Type 2 diabetes mellitus without complications: Secondary | ICD-10-CM | POA: Diagnosis not present

## 2018-12-18 DIAGNOSIS — M5136 Other intervertebral disc degeneration, lumbar region: Secondary | ICD-10-CM | POA: Diagnosis not present

## 2018-12-18 DIAGNOSIS — Z885 Allergy status to narcotic agent status: Secondary | ICD-10-CM | POA: Diagnosis not present

## 2018-12-18 DIAGNOSIS — Z886 Allergy status to analgesic agent status: Secondary | ICD-10-CM | POA: Diagnosis not present

## 2018-12-18 DIAGNOSIS — M47816 Spondylosis without myelopathy or radiculopathy, lumbar region: Secondary | ICD-10-CM | POA: Diagnosis not present

## 2018-12-18 DIAGNOSIS — M545 Low back pain: Secondary | ICD-10-CM | POA: Diagnosis not present

## 2018-12-18 DIAGNOSIS — Z888 Allergy status to other drugs, medicaments and biological substances status: Secondary | ICD-10-CM | POA: Diagnosis not present

## 2018-12-18 DIAGNOSIS — G8929 Other chronic pain: Secondary | ICD-10-CM | POA: Diagnosis not present

## 2018-12-18 DIAGNOSIS — Z794 Long term (current) use of insulin: Secondary | ICD-10-CM | POA: Diagnosis not present

## 2018-12-19 DIAGNOSIS — Z794 Long term (current) use of insulin: Secondary | ICD-10-CM | POA: Diagnosis not present

## 2018-12-19 DIAGNOSIS — E119 Type 2 diabetes mellitus without complications: Secondary | ICD-10-CM | POA: Diagnosis not present

## 2019-01-06 DIAGNOSIS — M47816 Spondylosis without myelopathy or radiculopathy, lumbar region: Secondary | ICD-10-CM | POA: Diagnosis not present

## 2019-01-06 DIAGNOSIS — G8929 Other chronic pain: Secondary | ICD-10-CM | POA: Diagnosis not present

## 2019-01-06 DIAGNOSIS — M545 Low back pain: Secondary | ICD-10-CM | POA: Diagnosis not present

## 2019-01-06 DIAGNOSIS — M5136 Other intervertebral disc degeneration, lumbar region: Secondary | ICD-10-CM | POA: Diagnosis not present

## 2019-01-12 DIAGNOSIS — M6281 Muscle weakness (generalized): Secondary | ICD-10-CM | POA: Diagnosis not present

## 2019-01-12 DIAGNOSIS — G8929 Other chronic pain: Secondary | ICD-10-CM | POA: Diagnosis not present

## 2019-01-12 DIAGNOSIS — Z7409 Other reduced mobility: Secondary | ICD-10-CM | POA: Diagnosis not present

## 2019-01-12 DIAGNOSIS — M545 Low back pain: Secondary | ICD-10-CM | POA: Diagnosis not present

## 2019-01-26 DIAGNOSIS — G8929 Other chronic pain: Secondary | ICD-10-CM | POA: Diagnosis not present

## 2019-01-26 DIAGNOSIS — Z7409 Other reduced mobility: Secondary | ICD-10-CM | POA: Diagnosis not present

## 2019-01-26 DIAGNOSIS — M6281 Muscle weakness (generalized): Secondary | ICD-10-CM | POA: Diagnosis not present

## 2019-01-26 DIAGNOSIS — M545 Low back pain: Secondary | ICD-10-CM | POA: Diagnosis not present

## 2019-02-10 DIAGNOSIS — Z7409 Other reduced mobility: Secondary | ICD-10-CM | POA: Diagnosis not present

## 2019-02-10 DIAGNOSIS — M545 Low back pain: Secondary | ICD-10-CM | POA: Diagnosis not present

## 2019-02-10 DIAGNOSIS — M5380 Other specified dorsopathies, site unspecified: Secondary | ICD-10-CM | POA: Diagnosis not present

## 2019-02-10 DIAGNOSIS — M6281 Muscle weakness (generalized): Secondary | ICD-10-CM | POA: Diagnosis not present

## 2019-02-10 DIAGNOSIS — G8929 Other chronic pain: Secondary | ICD-10-CM | POA: Diagnosis not present

## 2019-02-17 DIAGNOSIS — M5380 Other specified dorsopathies, site unspecified: Secondary | ICD-10-CM | POA: Diagnosis not present

## 2019-02-17 DIAGNOSIS — M545 Low back pain: Secondary | ICD-10-CM | POA: Diagnosis not present

## 2019-02-17 DIAGNOSIS — G8929 Other chronic pain: Secondary | ICD-10-CM | POA: Diagnosis not present

## 2019-02-17 DIAGNOSIS — Z7409 Other reduced mobility: Secondary | ICD-10-CM | POA: Diagnosis not present

## 2019-02-17 DIAGNOSIS — M6281 Muscle weakness (generalized): Secondary | ICD-10-CM | POA: Diagnosis not present

## 2019-02-18 DIAGNOSIS — R110 Nausea: Secondary | ICD-10-CM | POA: Diagnosis not present

## 2019-02-18 DIAGNOSIS — Z8619 Personal history of other infectious and parasitic diseases: Secondary | ICD-10-CM | POA: Diagnosis not present

## 2019-02-18 DIAGNOSIS — K219 Gastro-esophageal reflux disease without esophagitis: Secondary | ICD-10-CM | POA: Diagnosis not present

## 2019-02-21 DIAGNOSIS — M5441 Lumbago with sciatica, right side: Secondary | ICD-10-CM | POA: Diagnosis not present

## 2019-02-26 DIAGNOSIS — M545 Low back pain: Secondary | ICD-10-CM | POA: Diagnosis not present

## 2019-02-26 DIAGNOSIS — G8929 Other chronic pain: Secondary | ICD-10-CM | POA: Diagnosis not present

## 2019-02-26 DIAGNOSIS — M6281 Muscle weakness (generalized): Secondary | ICD-10-CM | POA: Diagnosis not present

## 2019-02-26 DIAGNOSIS — M5380 Other specified dorsopathies, site unspecified: Secondary | ICD-10-CM | POA: Diagnosis not present

## 2019-02-26 DIAGNOSIS — Z7409 Other reduced mobility: Secondary | ICD-10-CM | POA: Diagnosis not present

## 2019-03-10 DIAGNOSIS — M545 Low back pain: Secondary | ICD-10-CM | POA: Diagnosis not present

## 2019-03-10 DIAGNOSIS — G8929 Other chronic pain: Secondary | ICD-10-CM | POA: Diagnosis not present

## 2019-03-18 DIAGNOSIS — M2578 Osteophyte, vertebrae: Secondary | ICD-10-CM | POA: Diagnosis not present

## 2019-03-18 DIAGNOSIS — I10 Essential (primary) hypertension: Secondary | ICD-10-CM | POA: Diagnosis not present

## 2019-03-18 DIAGNOSIS — E119 Type 2 diabetes mellitus without complications: Secondary | ICD-10-CM | POA: Diagnosis not present

## 2019-03-18 DIAGNOSIS — M4726 Other spondylosis with radiculopathy, lumbar region: Secondary | ICD-10-CM | POA: Diagnosis not present

## 2019-03-18 DIAGNOSIS — Z794 Long term (current) use of insulin: Secondary | ICD-10-CM | POA: Diagnosis not present

## 2019-03-18 DIAGNOSIS — M5489 Other dorsalgia: Secondary | ICD-10-CM | POA: Diagnosis not present

## 2019-03-18 DIAGNOSIS — Z886 Allergy status to analgesic agent status: Secondary | ICD-10-CM | POA: Diagnosis not present

## 2019-03-18 DIAGNOSIS — R202 Paresthesia of skin: Secondary | ICD-10-CM | POA: Diagnosis not present

## 2019-03-18 DIAGNOSIS — M5116 Intervertebral disc disorders with radiculopathy, lumbar region: Secondary | ICD-10-CM | POA: Diagnosis not present

## 2019-03-18 DIAGNOSIS — E785 Hyperlipidemia, unspecified: Secondary | ICD-10-CM | POA: Diagnosis not present

## 2019-03-18 DIAGNOSIS — M5417 Radiculopathy, lumbosacral region: Secondary | ICD-10-CM | POA: Diagnosis not present

## 2019-03-25 DIAGNOSIS — E785 Hyperlipidemia, unspecified: Secondary | ICD-10-CM | POA: Diagnosis not present

## 2019-03-25 DIAGNOSIS — M545 Low back pain: Secondary | ICD-10-CM | POA: Diagnosis not present

## 2019-03-25 DIAGNOSIS — E119 Type 2 diabetes mellitus without complications: Secondary | ICD-10-CM | POA: Diagnosis not present

## 2019-03-25 DIAGNOSIS — M47816 Spondylosis without myelopathy or radiculopathy, lumbar region: Secondary | ICD-10-CM | POA: Diagnosis not present

## 2019-03-25 DIAGNOSIS — M5136 Other intervertebral disc degeneration, lumbar region: Secondary | ICD-10-CM | POA: Diagnosis not present

## 2019-03-25 DIAGNOSIS — Z794 Long term (current) use of insulin: Secondary | ICD-10-CM | POA: Diagnosis not present

## 2019-04-20 DIAGNOSIS — M25551 Pain in right hip: Secondary | ICD-10-CM | POA: Diagnosis not present

## 2019-05-08 DIAGNOSIS — S73191A Other sprain of right hip, initial encounter: Secondary | ICD-10-CM | POA: Diagnosis not present

## 2019-06-01 DIAGNOSIS — M25571 Pain in right ankle and joints of right foot: Secondary | ICD-10-CM | POA: Diagnosis not present

## 2019-06-01 DIAGNOSIS — S92215A Nondisplaced fracture of cuboid bone of left foot, initial encounter for closed fracture: Secondary | ICD-10-CM | POA: Diagnosis not present

## 2019-06-01 DIAGNOSIS — W010XXA Fall on same level from slipping, tripping and stumbling without subsequent striking against object, initial encounter: Secondary | ICD-10-CM | POA: Diagnosis not present

## 2019-06-01 DIAGNOSIS — M7752 Other enthesopathy of left foot: Secondary | ICD-10-CM | POA: Diagnosis not present

## 2019-06-01 DIAGNOSIS — R269 Unspecified abnormalities of gait and mobility: Secondary | ICD-10-CM | POA: Diagnosis not present

## 2019-06-01 DIAGNOSIS — M79672 Pain in left foot: Secondary | ICD-10-CM | POA: Diagnosis not present

## 2019-06-01 DIAGNOSIS — W19XXXA Unspecified fall, initial encounter: Secondary | ICD-10-CM | POA: Diagnosis not present

## 2019-06-01 DIAGNOSIS — Y999 Unspecified external cause status: Secondary | ICD-10-CM | POA: Diagnosis not present

## 2019-06-01 DIAGNOSIS — R2241 Localized swelling, mass and lump, right lower limb: Secondary | ICD-10-CM | POA: Diagnosis not present

## 2019-06-01 DIAGNOSIS — R000 Tachycardia, unspecified: Secondary | ICD-10-CM | POA: Diagnosis not present

## 2019-06-02 DIAGNOSIS — S92212A Displaced fracture of cuboid bone of left foot, initial encounter for closed fracture: Secondary | ICD-10-CM | POA: Diagnosis not present

## 2019-06-02 DIAGNOSIS — M25551 Pain in right hip: Secondary | ICD-10-CM | POA: Diagnosis not present

## 2019-06-02 DIAGNOSIS — R000 Tachycardia, unspecified: Secondary | ICD-10-CM | POA: Diagnosis not present

## 2019-06-23 DIAGNOSIS — R110 Nausea: Secondary | ICD-10-CM | POA: Diagnosis not present

## 2019-06-23 DIAGNOSIS — K219 Gastro-esophageal reflux disease without esophagitis: Secondary | ICD-10-CM | POA: Diagnosis not present

## 2019-07-24 DIAGNOSIS — M5136 Other intervertebral disc degeneration, lumbar region: Secondary | ICD-10-CM | POA: Diagnosis not present

## 2019-07-24 DIAGNOSIS — S73191D Other sprain of right hip, subsequent encounter: Secondary | ICD-10-CM | POA: Diagnosis not present

## 2019-08-11 DIAGNOSIS — K439 Ventral hernia without obstruction or gangrene: Secondary | ICD-10-CM | POA: Diagnosis not present

## 2019-08-11 DIAGNOSIS — Z794 Long term (current) use of insulin: Secondary | ICD-10-CM | POA: Diagnosis not present

## 2019-08-11 DIAGNOSIS — E119 Type 2 diabetes mellitus without complications: Secondary | ICD-10-CM | POA: Diagnosis not present

## 2019-08-19 DIAGNOSIS — K439 Ventral hernia without obstruction or gangrene: Secondary | ICD-10-CM | POA: Diagnosis not present

## 2019-10-12 DIAGNOSIS — R12 Heartburn: Secondary | ICD-10-CM | POA: Diagnosis not present

## 2019-10-12 DIAGNOSIS — R110 Nausea: Secondary | ICD-10-CM | POA: Diagnosis not present

## 2019-10-12 DIAGNOSIS — Z79899 Other long term (current) drug therapy: Secondary | ICD-10-CM | POA: Diagnosis not present

## 2019-10-12 DIAGNOSIS — K219 Gastro-esophageal reflux disease without esophagitis: Secondary | ICD-10-CM | POA: Diagnosis not present

## 2019-11-02 DIAGNOSIS — K439 Ventral hernia without obstruction or gangrene: Secondary | ICD-10-CM | POA: Diagnosis not present

## 2019-11-11 DIAGNOSIS — J3089 Other allergic rhinitis: Secondary | ICD-10-CM | POA: Diagnosis not present

## 2019-11-11 DIAGNOSIS — E785 Hyperlipidemia, unspecified: Secondary | ICD-10-CM | POA: Diagnosis not present

## 2019-11-11 DIAGNOSIS — I10 Essential (primary) hypertension: Secondary | ICD-10-CM | POA: Diagnosis not present

## 2019-11-11 DIAGNOSIS — K439 Ventral hernia without obstruction or gangrene: Secondary | ICD-10-CM | POA: Diagnosis not present

## 2019-11-11 DIAGNOSIS — Z794 Long term (current) use of insulin: Secondary | ICD-10-CM | POA: Diagnosis not present

## 2019-11-11 DIAGNOSIS — E119 Type 2 diabetes mellitus without complications: Secondary | ICD-10-CM | POA: Diagnosis not present

## 2019-11-12 DIAGNOSIS — E119 Type 2 diabetes mellitus without complications: Secondary | ICD-10-CM | POA: Diagnosis not present

## 2019-11-12 DIAGNOSIS — K219 Gastro-esophageal reflux disease without esophagitis: Secondary | ICD-10-CM | POA: Diagnosis not present

## 2019-11-12 DIAGNOSIS — Z01812 Encounter for preprocedural laboratory examination: Secondary | ICD-10-CM | POA: Diagnosis not present

## 2019-11-12 DIAGNOSIS — K436 Other and unspecified ventral hernia with obstruction, without gangrene: Secondary | ICD-10-CM | POA: Diagnosis not present

## 2019-11-12 DIAGNOSIS — Z794 Long term (current) use of insulin: Secondary | ICD-10-CM | POA: Diagnosis not present

## 2019-11-12 DIAGNOSIS — Z20822 Contact with and (suspected) exposure to covid-19: Secondary | ICD-10-CM | POA: Diagnosis not present

## 2019-11-12 DIAGNOSIS — I10 Essential (primary) hypertension: Secondary | ICD-10-CM | POA: Diagnosis not present

## 2019-11-12 DIAGNOSIS — Z79899 Other long term (current) drug therapy: Secondary | ICD-10-CM | POA: Diagnosis not present

## 2019-11-12 DIAGNOSIS — K439 Ventral hernia without obstruction or gangrene: Secondary | ICD-10-CM | POA: Diagnosis not present

## 2019-11-19 DIAGNOSIS — K420 Umbilical hernia with obstruction, without gangrene: Secondary | ICD-10-CM | POA: Diagnosis not present

## 2019-11-19 DIAGNOSIS — K439 Ventral hernia without obstruction or gangrene: Secondary | ICD-10-CM | POA: Diagnosis not present

## 2019-11-19 DIAGNOSIS — Z79899 Other long term (current) drug therapy: Secondary | ICD-10-CM | POA: Diagnosis not present

## 2019-11-19 DIAGNOSIS — K219 Gastro-esophageal reflux disease without esophagitis: Secondary | ICD-10-CM | POA: Diagnosis not present

## 2019-11-19 DIAGNOSIS — Z794 Long term (current) use of insulin: Secondary | ICD-10-CM | POA: Diagnosis not present

## 2019-11-19 DIAGNOSIS — E119 Type 2 diabetes mellitus without complications: Secondary | ICD-10-CM | POA: Diagnosis not present

## 2019-11-19 DIAGNOSIS — K436 Other and unspecified ventral hernia with obstruction, without gangrene: Secondary | ICD-10-CM | POA: Diagnosis not present

## 2019-11-19 DIAGNOSIS — I10 Essential (primary) hypertension: Secondary | ICD-10-CM | POA: Diagnosis not present

## 2019-11-26 ENCOUNTER — Other Ambulatory Visit: Payer: Self-pay

## 2019-11-26 NOTE — Patient Outreach (Signed)
[facility]) Care Management  11/26/2019  CHIBUEZE BEASLEY May 22, 1976 [id number]   Medication Adherence call to Mr. Andrik Sandt patient telephone number is disconnected . Mr. Portnoy is showing past due under [employer].  Lillia Abed CPhT Pharmacy Technician [facility] Care Management Direct Dial [phone]  Fax [phone] [email or web address]

## 2019-12-01 DIAGNOSIS — R4781 Slurred speech: Secondary | ICD-10-CM | POA: Diagnosis not present

## 2020-03-14 DIAGNOSIS — Z794 Long term (current) use of insulin: Secondary | ICD-10-CM | POA: Diagnosis not present

## 2020-03-14 DIAGNOSIS — E119 Type 2 diabetes mellitus without complications: Secondary | ICD-10-CM | POA: Diagnosis not present

## 2020-03-14 DIAGNOSIS — I10 Essential (primary) hypertension: Secondary | ICD-10-CM | POA: Diagnosis not present

## 2020-03-14 DIAGNOSIS — E785 Hyperlipidemia, unspecified: Secondary | ICD-10-CM | POA: Diagnosis not present

## 2020-04-06 DIAGNOSIS — Z885 Allergy status to narcotic agent status: Secondary | ICD-10-CM | POA: Diagnosis not present

## 2020-04-06 DIAGNOSIS — Z886 Allergy status to analgesic agent status: Secondary | ICD-10-CM | POA: Diagnosis not present

## 2020-04-06 DIAGNOSIS — Z888 Allergy status to other drugs, medicaments and biological substances status: Secondary | ICD-10-CM | POA: Diagnosis not present

## 2020-04-06 DIAGNOSIS — Z91041 Radiographic dye allergy status: Secondary | ICD-10-CM | POA: Diagnosis not present

## 2020-04-06 DIAGNOSIS — K219 Gastro-esophageal reflux disease without esophagitis: Secondary | ICD-10-CM | POA: Diagnosis not present

## 2020-04-06 DIAGNOSIS — R110 Nausea: Secondary | ICD-10-CM | POA: Diagnosis not present

## 2020-09-28 DIAGNOSIS — Z79899 Other long term (current) drug therapy: Secondary | ICD-10-CM | POA: Diagnosis not present

## 2020-09-28 DIAGNOSIS — R110 Nausea: Secondary | ICD-10-CM | POA: Diagnosis not present

## 2020-09-28 DIAGNOSIS — K219 Gastro-esophageal reflux disease without esophagitis: Secondary | ICD-10-CM | POA: Diagnosis not present

## 2021-03-09 DIAGNOSIS — K219 Gastro-esophageal reflux disease without esophagitis: Secondary | ICD-10-CM | POA: Diagnosis not present

## 2021-03-09 DIAGNOSIS — R1013 Epigastric pain: Secondary | ICD-10-CM | POA: Diagnosis not present

## 2021-03-09 DIAGNOSIS — K439 Ventral hernia without obstruction or gangrene: Secondary | ICD-10-CM | POA: Diagnosis not present

## 2021-03-09 DIAGNOSIS — E119 Type 2 diabetes mellitus without complications: Secondary | ICD-10-CM | POA: Diagnosis not present

## 2021-03-09 DIAGNOSIS — Z794 Long term (current) use of insulin: Secondary | ICD-10-CM | POA: Diagnosis not present

## 2021-03-09 DIAGNOSIS — Z7984 Long term (current) use of oral hypoglycemic drugs: Secondary | ICD-10-CM | POA: Diagnosis not present

## 2021-03-11 DIAGNOSIS — E119 Type 2 diabetes mellitus without complications: Secondary | ICD-10-CM | POA: Diagnosis not present

## 2021-03-11 DIAGNOSIS — R1031 Right lower quadrant pain: Secondary | ICD-10-CM | POA: Diagnosis not present

## 2021-03-11 DIAGNOSIS — R1011 Right upper quadrant pain: Secondary | ICD-10-CM | POA: Diagnosis not present

## 2021-03-11 DIAGNOSIS — R1013 Epigastric pain: Secondary | ICD-10-CM | POA: Diagnosis not present

## 2021-03-15 DIAGNOSIS — G8929 Other chronic pain: Secondary | ICD-10-CM | POA: Diagnosis not present

## 2021-03-15 DIAGNOSIS — R110 Nausea: Secondary | ICD-10-CM | POA: Diagnosis not present

## 2021-03-15 DIAGNOSIS — K219 Gastro-esophageal reflux disease without esophagitis: Secondary | ICD-10-CM | POA: Diagnosis not present

## 2021-03-15 DIAGNOSIS — R1013 Epigastric pain: Secondary | ICD-10-CM | POA: Diagnosis not present

## 2021-04-12 DIAGNOSIS — E1165 Type 2 diabetes mellitus with hyperglycemia: Secondary | ICD-10-CM | POA: Diagnosis not present

## 2021-04-12 DIAGNOSIS — Z7984 Long term (current) use of oral hypoglycemic drugs: Secondary | ICD-10-CM | POA: Diagnosis not present

## 2021-04-12 DIAGNOSIS — K429 Umbilical hernia without obstruction or gangrene: Secondary | ICD-10-CM | POA: Diagnosis not present

## 2021-04-28 DIAGNOSIS — Z794 Long term (current) use of insulin: Secondary | ICD-10-CM | POA: Diagnosis not present

## 2021-04-28 DIAGNOSIS — E119 Type 2 diabetes mellitus without complications: Secondary | ICD-10-CM | POA: Diagnosis not present

## 2021-04-28 DIAGNOSIS — I10 Essential (primary) hypertension: Secondary | ICD-10-CM | POA: Diagnosis not present

## 2021-04-28 DIAGNOSIS — E1165 Type 2 diabetes mellitus with hyperglycemia: Secondary | ICD-10-CM | POA: Diagnosis not present

## 2021-06-21 DIAGNOSIS — E1165 Type 2 diabetes mellitus with hyperglycemia: Secondary | ICD-10-CM | POA: Diagnosis not present

## 2021-06-21 DIAGNOSIS — G8929 Other chronic pain: Secondary | ICD-10-CM | POA: Diagnosis not present

## 2021-06-21 DIAGNOSIS — K219 Gastro-esophageal reflux disease without esophagitis: Secondary | ICD-10-CM | POA: Diagnosis not present

## 2021-06-21 DIAGNOSIS — Z7984 Long term (current) use of oral hypoglycemic drugs: Secondary | ICD-10-CM | POA: Diagnosis not present

## 2021-06-21 DIAGNOSIS — R110 Nausea: Secondary | ICD-10-CM | POA: Diagnosis not present

## 2021-06-21 DIAGNOSIS — Z794 Long term (current) use of insulin: Secondary | ICD-10-CM | POA: Diagnosis not present

## 2021-07-12 DIAGNOSIS — F17210 Nicotine dependence, cigarettes, uncomplicated: Secondary | ICD-10-CM | POA: Diagnosis not present

## 2021-07-12 DIAGNOSIS — I10 Essential (primary) hypertension: Secondary | ICD-10-CM | POA: Diagnosis not present

## 2021-07-12 DIAGNOSIS — K3189 Other diseases of stomach and duodenum: Secondary | ICD-10-CM | POA: Diagnosis not present

## 2021-07-12 DIAGNOSIS — R1013 Epigastric pain: Secondary | ICD-10-CM | POA: Diagnosis not present

## 2021-07-12 DIAGNOSIS — E119 Type 2 diabetes mellitus without complications: Secondary | ICD-10-CM | POA: Diagnosis not present

## 2021-07-12 DIAGNOSIS — E785 Hyperlipidemia, unspecified: Secondary | ICD-10-CM | POA: Diagnosis not present

## 2021-07-12 DIAGNOSIS — Z79899 Other long term (current) drug therapy: Secondary | ICD-10-CM | POA: Diagnosis not present

## 2021-08-08 DIAGNOSIS — R110 Nausea: Secondary | ICD-10-CM | POA: Diagnosis not present

## 2021-08-08 DIAGNOSIS — R1013 Epigastric pain: Secondary | ICD-10-CM | POA: Diagnosis not present

## 2021-08-08 DIAGNOSIS — K219 Gastro-esophageal reflux disease without esophagitis: Secondary | ICD-10-CM | POA: Diagnosis not present

## 2022-02-28 DIAGNOSIS — K219 Gastro-esophageal reflux disease without esophagitis: Secondary | ICD-10-CM | POA: Diagnosis not present

## 2022-05-29 ENCOUNTER — Other Ambulatory Visit: Payer: Self-pay

## 2022-05-29 NOTE — Patient Outreach (Signed)
  Care Coordination   05/29/2022 Name: SEVYN PAREDEZ MRN: [id number] DOB: 12-Jul-1976   Care Coordination Outreach Attempts:  An unsuccessful telephone outreach was attempted today to offer the patient information about available care coordination services as a benefit of their health plan.   Follow Up Plan:  Additional outreach attempts will be made to offer the patient care coordination information and services.   Encounter Outcome:  No Answer  Care Coordination Interventions Activated:  No   Care Coordination Interventions:  No, not indicated    Rowe Pavy, RN, BSN, CEN [facility] [phone]

## 2022-06-04 ENCOUNTER — Other Ambulatory Visit: Payer: Self-pay

## 2022-06-04 NOTE — Patient Outreach (Signed)
  Care Coordination   06/04/2022 Name: Vincent Graves MRN: [id number] DOB: 1976/06/01   Care Coordination Outreach Attempts:  A second unsuccessful outreach was attempted today to offer the patient with information about available care coordination services as a benefit of their health plan.     Follow Up Plan:  Additional outreach attempts will be made to offer the patient care coordination information and services.   Encounter Outcome:  No Answer  Care Coordination Interventions Activated:  No   Care Coordination Interventions:  No, not indicated    Rowe Pavy, RN, BSN, CEN [facility] [phone]

## 2022-07-02 ENCOUNTER — Telehealth: Payer: Self-pay

## 2022-07-02 NOTE — Patient Outreach (Signed)
  Care Coordination   07/02/2022 Name: Vincent Graves MRN: [id number] DOB: Dec 24, 1975   Care Coordination Outreach Attempts:  A third unsuccessful outreach was attempted today to offer the patient with information about available care coordination services as a benefit of their health plan.   Follow Up Plan:  No further outreach attempts will be made at this time. We have been unable to contact the patient to offer or enroll patient in care coordination services  Encounter Outcome:  No Answer  Care Coordination Interventions Activated:  No   Care Coordination Interventions:  No, not indicated    Tomasa Rand, RN, BSN, CEN [place] Coordinator [phone]

## 2022-07-13 DIAGNOSIS — K439 Ventral hernia without obstruction or gangrene: Secondary | ICD-10-CM | POA: Diagnosis not present

## 2022-07-13 DIAGNOSIS — Z794 Long term (current) use of insulin: Secondary | ICD-10-CM | POA: Diagnosis not present

## 2022-07-13 DIAGNOSIS — I10 Essential (primary) hypertension: Secondary | ICD-10-CM | POA: Diagnosis not present

## 2022-07-13 DIAGNOSIS — Z23 Encounter for immunization: Secondary | ICD-10-CM | POA: Diagnosis not present

## 2022-07-13 DIAGNOSIS — Z0000 Encounter for general adult medical examination without abnormal findings: Secondary | ICD-10-CM | POA: Diagnosis not present

## 2022-07-13 DIAGNOSIS — E119 Type 2 diabetes mellitus without complications: Secondary | ICD-10-CM | POA: Diagnosis not present

## 2022-07-13 DIAGNOSIS — Z87891 Personal history of nicotine dependence: Secondary | ICD-10-CM | POA: Diagnosis not present

## 2022-07-15 DIAGNOSIS — Z7984 Long term (current) use of oral hypoglycemic drugs: Secondary | ICD-10-CM | POA: Diagnosis not present

## 2022-07-15 DIAGNOSIS — H538 Other visual disturbances: Secondary | ICD-10-CM | POA: Diagnosis not present

## 2022-07-15 DIAGNOSIS — R42 Dizziness and giddiness: Secondary | ICD-10-CM | POA: Diagnosis not present

## 2022-07-15 DIAGNOSIS — Z7985 Long-term (current) use of injectable non-insulin antidiabetic drugs: Secondary | ICD-10-CM | POA: Diagnosis not present

## 2022-07-15 DIAGNOSIS — Z91119 Patient's noncompliance with dietary regimen due to unspecified reason: Secondary | ICD-10-CM | POA: Diagnosis not present

## 2022-07-15 DIAGNOSIS — Z794 Long term (current) use of insulin: Secondary | ICD-10-CM | POA: Diagnosis not present

## 2022-07-15 DIAGNOSIS — E1165 Type 2 diabetes mellitus with hyperglycemia: Secondary | ICD-10-CM | POA: Diagnosis not present

## 2022-07-15 DIAGNOSIS — R350 Frequency of micturition: Secondary | ICD-10-CM | POA: Diagnosis not present

## 2022-07-16 DIAGNOSIS — R000 Tachycardia, unspecified: Secondary | ICD-10-CM | POA: Diagnosis not present

## 2022-07-20 DIAGNOSIS — Z7984 Long term (current) use of oral hypoglycemic drugs: Secondary | ICD-10-CM | POA: Diagnosis not present

## 2022-07-20 DIAGNOSIS — E1165 Type 2 diabetes mellitus with hyperglycemia: Secondary | ICD-10-CM | POA: Diagnosis not present

## 2022-07-20 DIAGNOSIS — Z794 Long term (current) use of insulin: Secondary | ICD-10-CM | POA: Diagnosis not present

## 2022-07-27 DIAGNOSIS — Z794 Long term (current) use of insulin: Secondary | ICD-10-CM | POA: Diagnosis not present

## 2022-07-27 DIAGNOSIS — E1165 Type 2 diabetes mellitus with hyperglycemia: Secondary | ICD-10-CM | POA: Diagnosis not present

## 2022-07-27 DIAGNOSIS — Z7984 Long term (current) use of oral hypoglycemic drugs: Secondary | ICD-10-CM | POA: Diagnosis not present

## 2022-08-02 DIAGNOSIS — E119 Type 2 diabetes mellitus without complications: Secondary | ICD-10-CM | POA: Diagnosis not present

## 2022-08-07 DIAGNOSIS — Z794 Long term (current) use of insulin: Secondary | ICD-10-CM | POA: Diagnosis not present

## 2022-08-07 DIAGNOSIS — E119 Type 2 diabetes mellitus without complications: Secondary | ICD-10-CM | POA: Diagnosis not present

## 2022-08-07 DIAGNOSIS — B351 Tinea unguium: Secondary | ICD-10-CM | POA: Diagnosis not present

## 2022-08-08 DIAGNOSIS — Z794 Long term (current) use of insulin: Secondary | ICD-10-CM | POA: Diagnosis not present

## 2022-08-08 DIAGNOSIS — Z7984 Long term (current) use of oral hypoglycemic drugs: Secondary | ICD-10-CM | POA: Diagnosis not present

## 2022-08-08 DIAGNOSIS — E119 Type 2 diabetes mellitus without complications: Secondary | ICD-10-CM | POA: Diagnosis not present

## 2022-08-08 DIAGNOSIS — Z7985 Long-term (current) use of injectable non-insulin antidiabetic drugs: Secondary | ICD-10-CM | POA: Diagnosis not present

## 2022-08-10 DIAGNOSIS — Z7985 Long-term (current) use of injectable non-insulin antidiabetic drugs: Secondary | ICD-10-CM | POA: Diagnosis not present

## 2022-08-10 DIAGNOSIS — Z794 Long term (current) use of insulin: Secondary | ICD-10-CM | POA: Diagnosis not present

## 2022-08-10 DIAGNOSIS — E1165 Type 2 diabetes mellitus with hyperglycemia: Secondary | ICD-10-CM | POA: Diagnosis not present

## 2022-08-10 DIAGNOSIS — Z7984 Long term (current) use of oral hypoglycemic drugs: Secondary | ICD-10-CM | POA: Diagnosis not present

## 2022-09-02 DIAGNOSIS — E119 Type 2 diabetes mellitus without complications: Secondary | ICD-10-CM | POA: Diagnosis not present

## 2022-09-11 DIAGNOSIS — Z886 Allergy status to analgesic agent status: Secondary | ICD-10-CM | POA: Diagnosis not present

## 2022-09-11 DIAGNOSIS — G629 Polyneuropathy, unspecified: Secondary | ICD-10-CM | POA: Diagnosis not present

## 2022-09-11 DIAGNOSIS — Z91041 Radiographic dye allergy status: Secondary | ICD-10-CM | POA: Diagnosis not present

## 2022-09-11 DIAGNOSIS — Z888 Allergy status to other drugs, medicaments and biological substances status: Secondary | ICD-10-CM | POA: Diagnosis not present

## 2022-09-11 DIAGNOSIS — Z885 Allergy status to narcotic agent status: Secondary | ICD-10-CM | POA: Diagnosis not present

## 2022-09-11 DIAGNOSIS — G4733 Obstructive sleep apnea (adult) (pediatric): Secondary | ICD-10-CM | POA: Diagnosis not present

## 2022-10-02 DIAGNOSIS — E119 Type 2 diabetes mellitus without complications: Secondary | ICD-10-CM | POA: Diagnosis not present

## 2022-10-17 DIAGNOSIS — E1165 Type 2 diabetes mellitus with hyperglycemia: Secondary | ICD-10-CM | POA: Diagnosis not present

## 2022-10-17 DIAGNOSIS — Z794 Long term (current) use of insulin: Secondary | ICD-10-CM | POA: Diagnosis not present

## 2022-10-17 DIAGNOSIS — Z23 Encounter for immunization: Secondary | ICD-10-CM | POA: Diagnosis not present

## 2022-11-02 DIAGNOSIS — E119 Type 2 diabetes mellitus without complications: Secondary | ICD-10-CM | POA: Diagnosis not present
